# Patient Record
Sex: Male | Born: 2000 | Hispanic: Yes | Marital: Single | Smoking: Never smoker
Health system: Southern US, Community
[De-identification: ages and names within clinical notes are randomized; demographics above are authoritative.]

---
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
## 2019-05-12 IMAGING — DX DG FOREARM 2V*R*
2 series · 2 of 2 positions shown · non-contrast
Comparison: None.

CLINICAL DATA: Right forearm pain.  No injury.

EXAM:
RIGHT FOREARM - 2 VIEW

[forearm ap]
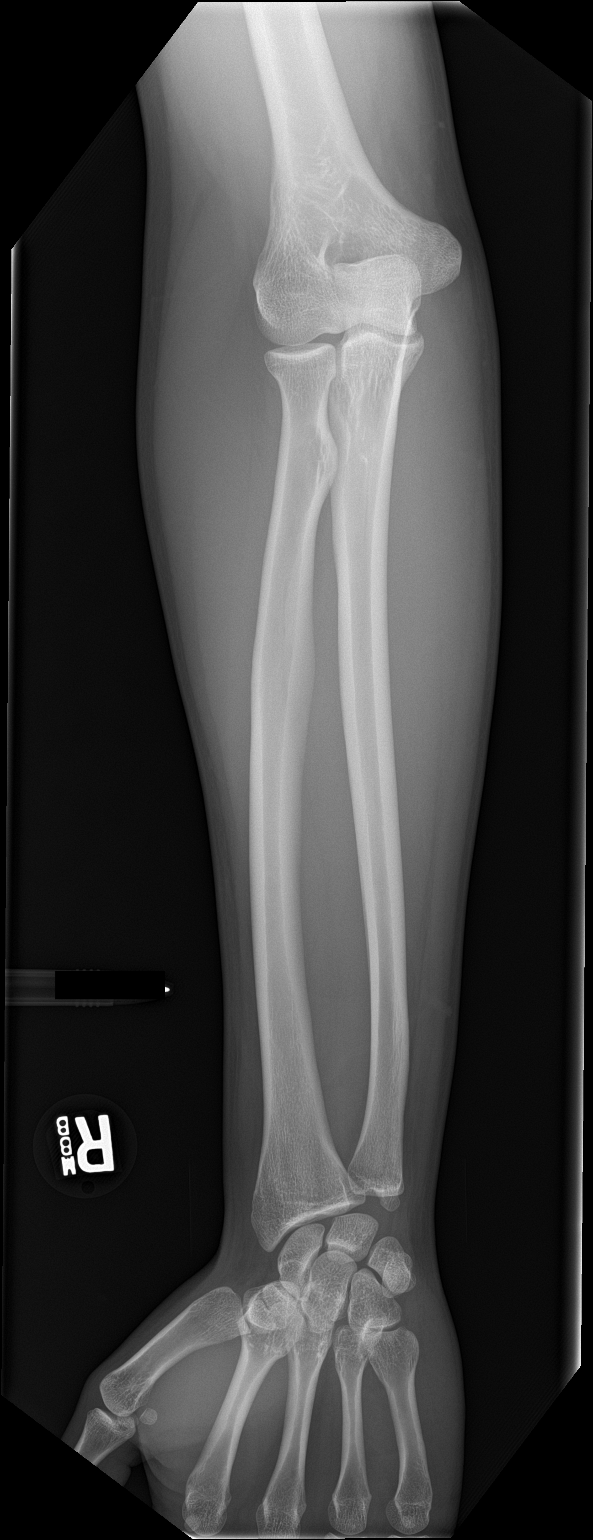

[forearm lat]
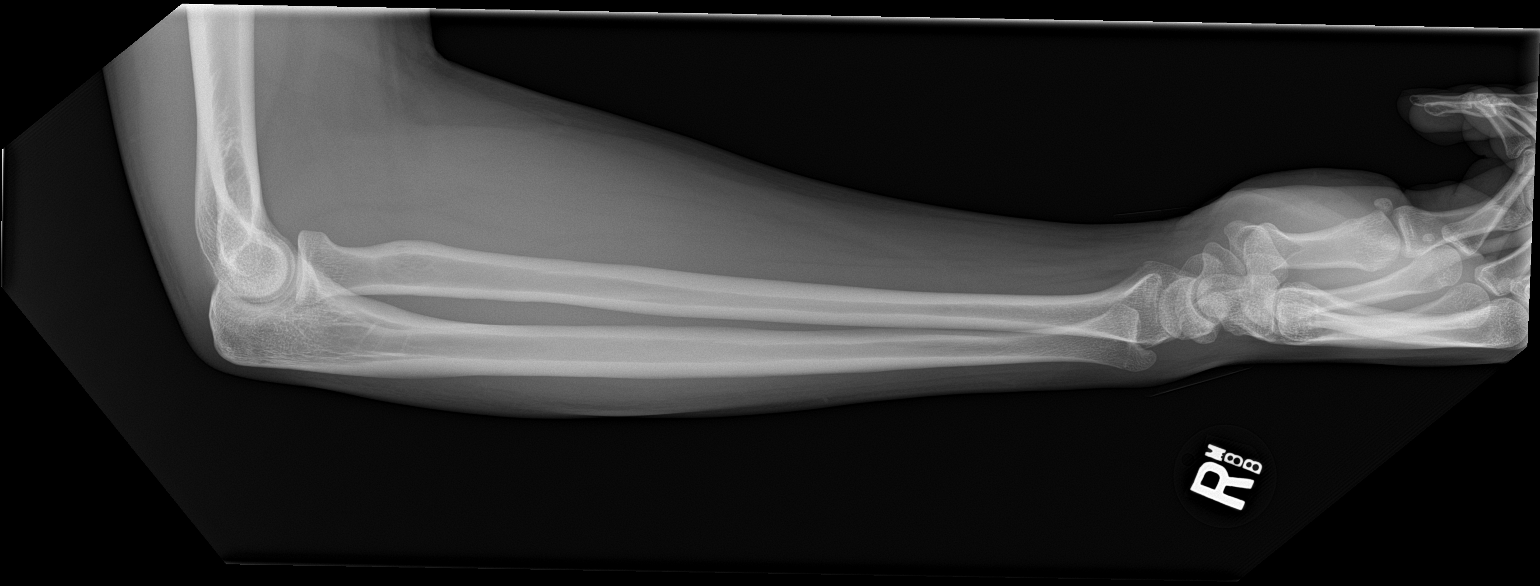

[2 of 2 positions shown; findings below may reference images not displayed]

FINDINGS: There is no evidence of fracture or other focal bone lesions. Soft
tissues are unremarkable.
IMPRESSION: Negative.
